# Patient Record
Sex: Female | Born: 2000 | Race: White | Hispanic: No | Marital: Single | Smoking: Never smoker
Health system: Southern US, Community
[De-identification: ages and names within clinical notes are randomized; demographics above are authoritative.]

## PROBLEM LIST (undated history)

## (undated) DIAGNOSIS — J45909 Unspecified asthma, uncomplicated: Secondary | ICD-10-CM

---
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
## 2019-02-06 ENCOUNTER — Ambulatory Visit: Payer: BC Managed Care – PPO | Admitting: Podiatry

## 2019-02-06 ENCOUNTER — Encounter: Payer: Self-pay | Admitting: Podiatry

## 2019-02-06 ENCOUNTER — Other Ambulatory Visit: Payer: Self-pay

## 2019-02-06 DIAGNOSIS — L600 Ingrowing nail: Secondary | ICD-10-CM

## 2019-02-06 MED ORDER — NEOMYCIN-POLYMYXIN-HC 3.5-10000-1 OT SOLN: OTIC | 1 refills | Status: AC

## 2019-02-06 NOTE — Patient Instructions (Signed)

## 2019-02-10 NOTE — Progress Notes (Signed)
Subjective:   Patient ID: Jordan Carney, female   DOB: 18 y.o.   MRN: [id number]   HPI Patient states that she has had chronic problems with her left big toenail with it being thickened and deformed and states that the right one is slightly discolored but the left one has been an ongoing issue for several years and it has fallen off a number of times.  Patient does not smoke likes to be active   Review of Systems  All other systems reviewed and are negative.       Objective:  Physical Exam Vitals signs and nursing note reviewed.  Constitutional:      Appearance: She is well-developed.  Pulmonary:     Effort: Pulmonary effort is normal.  Musculoskeletal: Normal range of motion.  Skin:    General: Skin is warm.  Neurological:     Mental Status: She is alert.     Neurovascular status found to be intact muscle strength was found to be adequate range of motion within normal limits.  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  Patient understands this and wants permanent removal of the nailbed and I allowed her to read the consent form reviewing this and she understands the risks associated with procedure and is willing to accept risk and after review signed consent form.  Patient is scheduled for this to be done now and I infiltrated the left hallux 60 mg Xylocaine Marcaine mixture I removed the hallux nail I exposed matrix and I applied phenol 3 applications 30 seconds followed by alcohol lavage sterile dressing and gave instructions on soaks.  Leave dressing on 24 hours but take  it off earlier if it should start to throb and drops were prescribed and she is encouraged to call with questions concerns

## 2019-02-18 ENCOUNTER — Telehealth: Payer: Self-pay | Admitting: *Deleted
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
## 2019-02-18 NOTE — Telephone Encounter (Signed)
I told pt she may have redness, drainage, swelling to varying degrees for about 4-6 weeks, she should continue the 2 x day soaking in 1/4 cup epsom salt to 1 qt warm water, then either allow to air dry or pat dry and apply the drops and bandaid for about 2-3 weeks, after that time period she could soak daily to twice daily and apply the drops once daily and when resting allow the area to air day, and when she got a dry hard scab without redness, drainage or pain she could stop the soaks.
